# Patient Record
Sex: Female | Born: 1965 | Race: Black or African American | Hispanic: No | Marital: Married | Smoking: Never smoker
Health system: Southern US, Community
[De-identification: ages and names within clinical notes are randomized; demographics above are authoritative.]

## PROBLEM LIST (undated history)

## (undated) DIAGNOSIS — E079 Disorder of thyroid, unspecified: Secondary | ICD-10-CM

---

## 2008-09-30 DIAGNOSIS — E0590 Thyrotoxicosis, unspecified without thyrotoxic crisis or storm: Secondary | ICD-10-CM | POA: Insufficient documentation

## 2008-09-30 DIAGNOSIS — E039 Hypothyroidism, unspecified: Secondary | ICD-10-CM | POA: Insufficient documentation

## 2008-09-30 DIAGNOSIS — R109 Unspecified abdominal pain: Secondary | ICD-10-CM | POA: Insufficient documentation

## 2011-04-22 DIAGNOSIS — Z79899 Other long term (current) drug therapy: Secondary | ICD-10-CM | POA: Insufficient documentation

## 2011-04-22 DIAGNOSIS — M7918 Myalgia, other site: Secondary | ICD-10-CM

## 2011-04-22 DIAGNOSIS — IMO0001 Reserved for inherently not codable concepts without codable children: Secondary | ICD-10-CM | POA: Insufficient documentation

## 2011-04-22 DIAGNOSIS — E079 Disorder of thyroid, unspecified: Secondary | ICD-10-CM | POA: Insufficient documentation

## 2011-04-22 DIAGNOSIS — R109 Unspecified abdominal pain: Secondary | ICD-10-CM | POA: Insufficient documentation

## 2011-04-22 HISTORY — DX: Disorder of thyroid, unspecified: E07.9

## 2011-04-22 MED ADMIN — Morphine Sulfate Inj 4 MG/ML: 4 mg | INTRAVENOUS | NDC 00409189101

## 2011-04-22 MED ADMIN — Sodium Chloride IV Soln 0.9%: 1000 mL | INTRAVENOUS | NDC 00338004904

## 2011-04-22 MED ADMIN — Oxycodone w/ Acetaminophen Tab 5-325 MG: 2 | ORAL | NDC 68084035511

## 2012-02-29 DIAGNOSIS — Z1231 Encounter for screening mammogram for malignant neoplasm of breast: Secondary | ICD-10-CM

## 2012-03-05 DIAGNOSIS — Z1231 Encounter for screening mammogram for malignant neoplasm of breast: Secondary | ICD-10-CM | POA: Insufficient documentation

## 2013-11-13 DIAGNOSIS — Z1231 Encounter for screening mammogram for malignant neoplasm of breast: Secondary | ICD-10-CM

## 2013-11-25 DIAGNOSIS — Z1231 Encounter for screening mammogram for malignant neoplasm of breast: Secondary | ICD-10-CM | POA: Insufficient documentation

## 2019-04-16 DIAGNOSIS — Z20822 Contact with and (suspected) exposure to covid-19: Secondary | ICD-10-CM

## 2020-05-17 DIAGNOSIS — Z1231 Encounter for screening mammogram for malignant neoplasm of breast: Secondary | ICD-10-CM

## 2020-07-05 DIAGNOSIS — Z1231 Encounter for screening mammogram for malignant neoplasm of breast: Secondary | ICD-10-CM

## 2021-11-28 DIAGNOSIS — M7752 Other enthesopathy of left foot: Secondary | ICD-10-CM

## 2021-11-28 DIAGNOSIS — M25472 Effusion, left ankle: Secondary | ICD-10-CM

## 2021-11-28 DIAGNOSIS — M79672 Pain in left foot: Secondary | ICD-10-CM

## 2021-12-01 DIAGNOSIS — M25472 Effusion, left ankle: Secondary | ICD-10-CM

## 2021-12-01 DIAGNOSIS — M7752 Other enthesopathy of left foot: Secondary | ICD-10-CM

## 2021-12-01 DIAGNOSIS — M79672 Pain in left foot: Secondary | ICD-10-CM

## 2022-01-06 DIAGNOSIS — M7662 Achilles tendinitis, left leg: Secondary | ICD-10-CM | POA: Diagnosis not present

## 2022-02-02 DIAGNOSIS — N644 Mastodynia: Secondary | ICD-10-CM

## 2022-02-08 IMAGING — MG DIGITAL SCREENING BILAT W/ CAD
7 series · 7 of 7 positions shown · non-contrast
Comparison: Previous exam(s).

CLINICAL DATA: Screening.

EXAM:
DIGITAL SCREENING BILATERAL MAMMOGRAM WITH CAD
TECHNIQUE: Bilateral screening digital craniocaudal and mediolateral oblique
mammograms were obtained. The images were evaluated with
computer-aided detection.

[L CV]
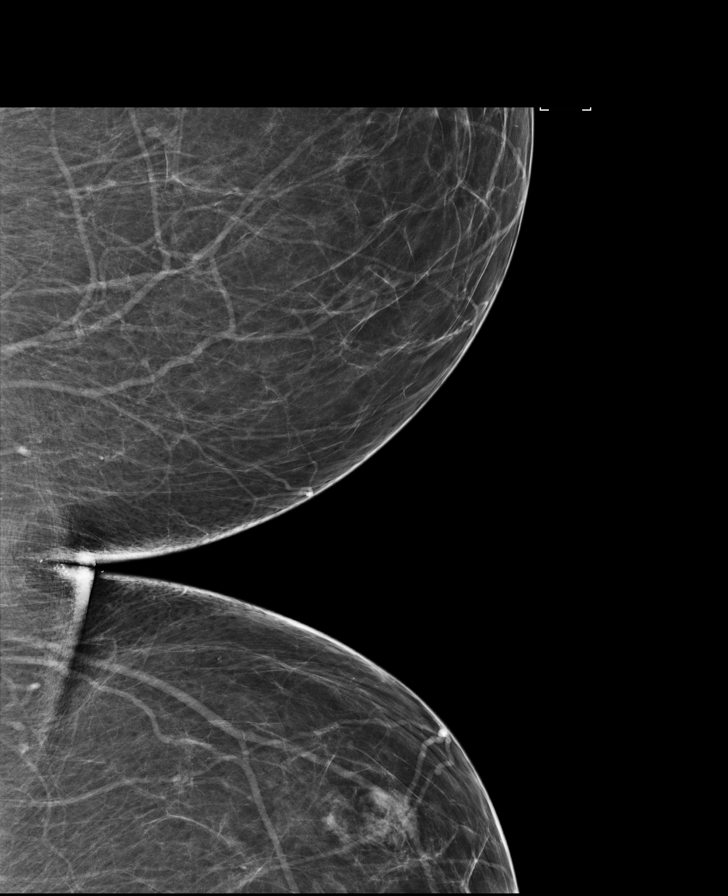

[R MLO (1 of 2)]
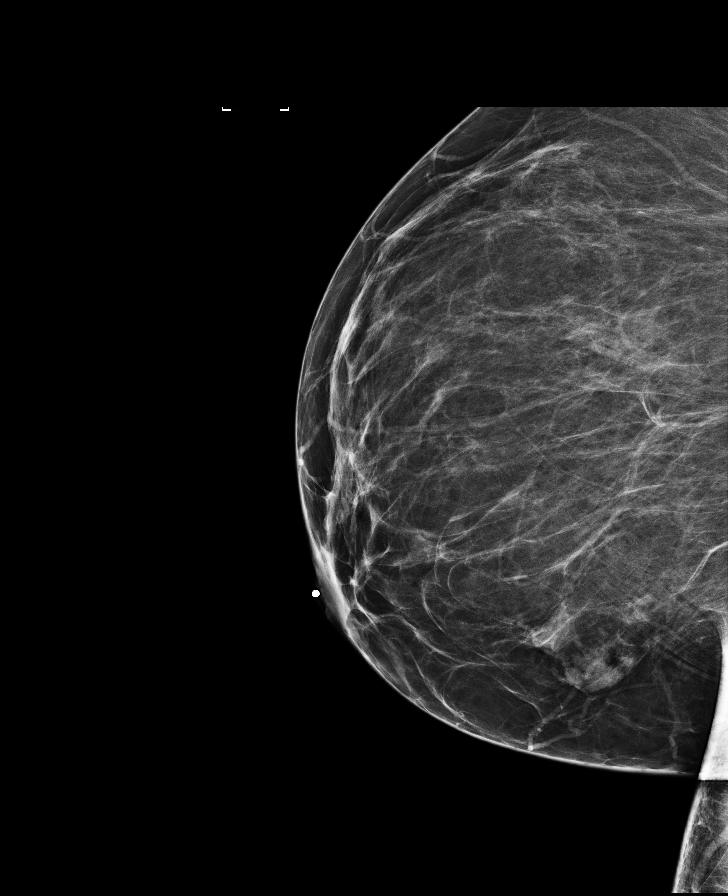

[L MLO (1 of 2)]
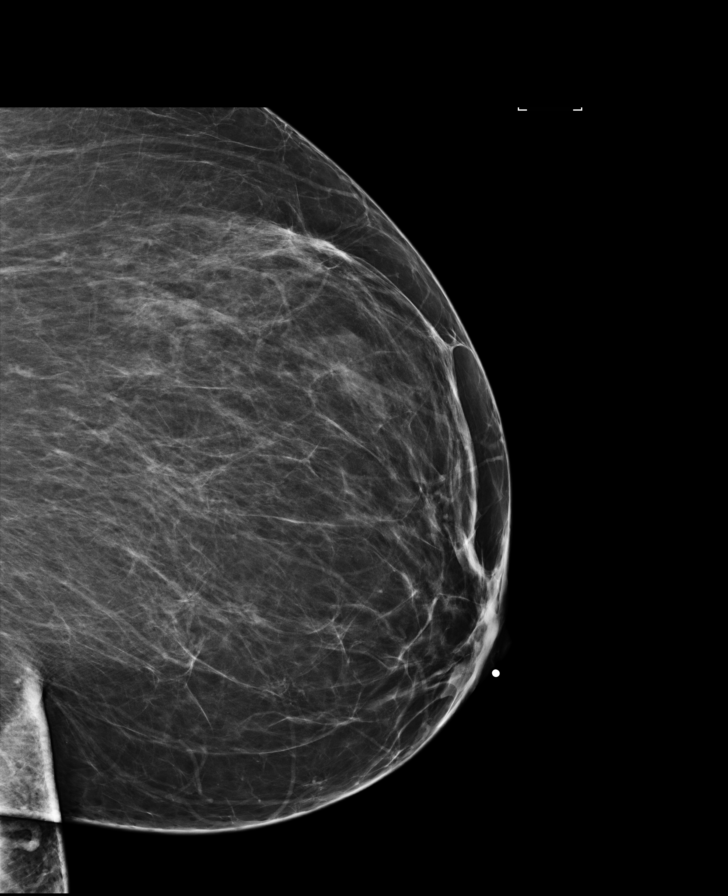

[L CC]
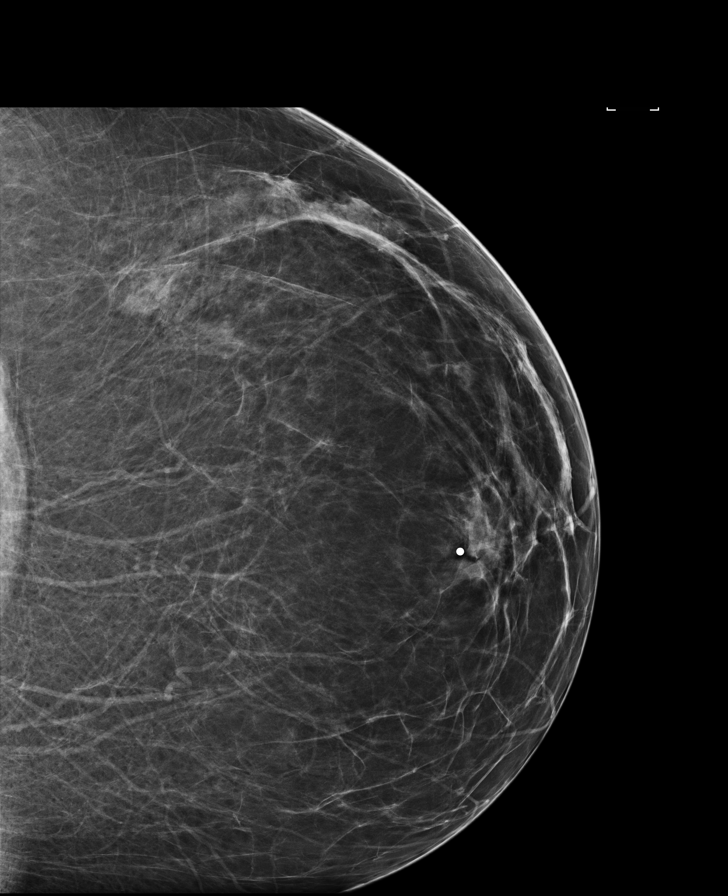

[L MLO (2 of 2)]
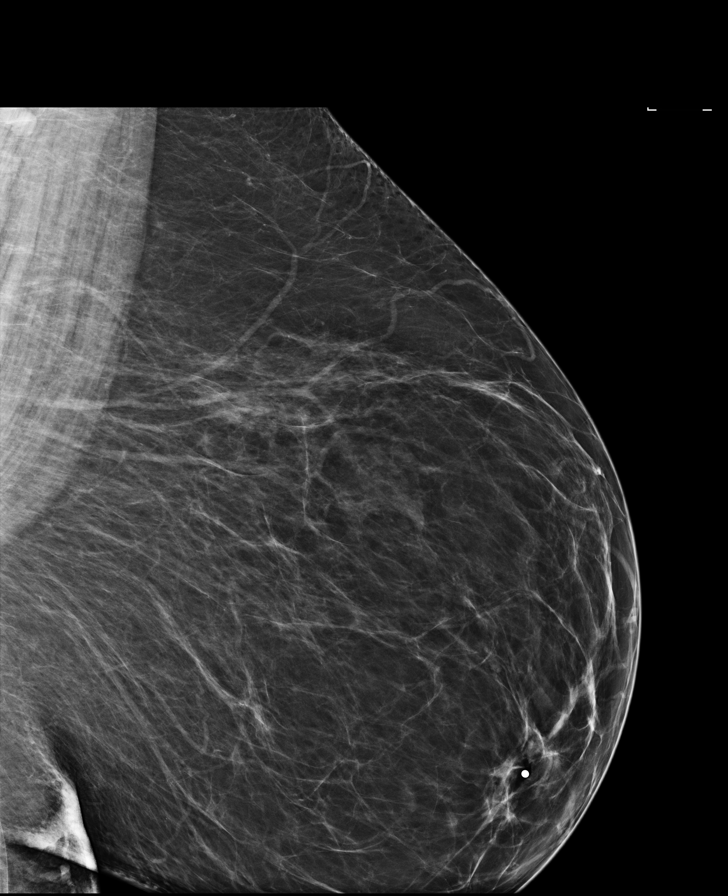

[R MLO (2 of 2)]
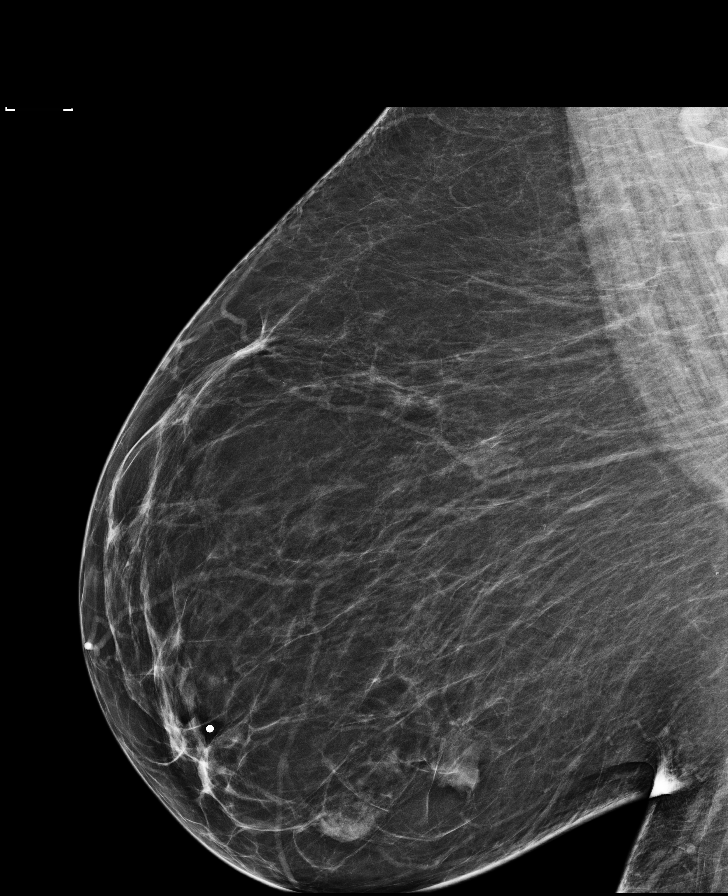

[R CC]
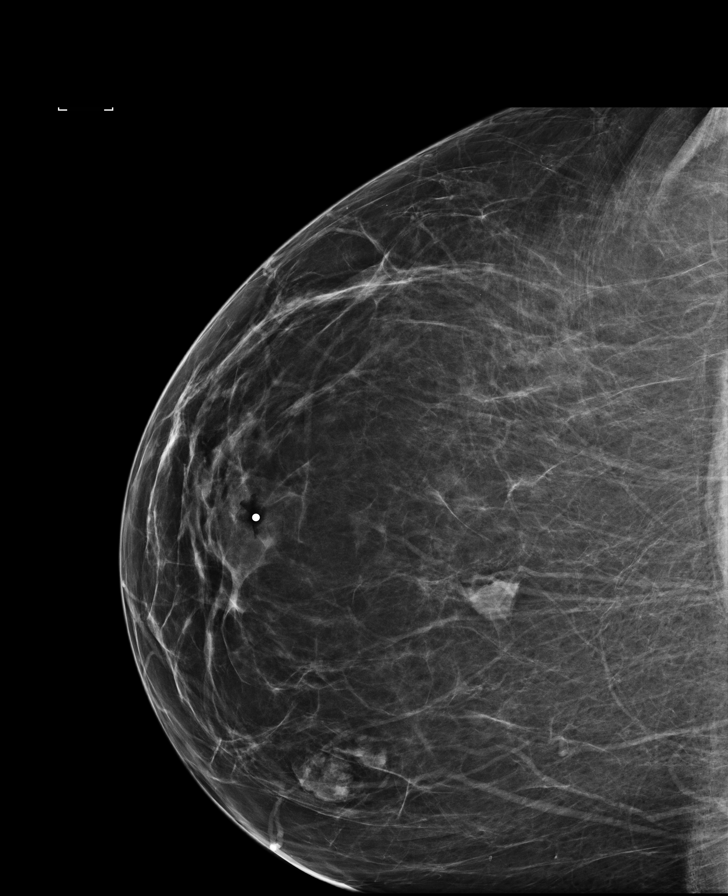

[7 of 7 positions shown; findings below may reference images not displayed]

ACR Breast Density Category b: There are scattered areas of
fibroglandular density.
FINDINGS: There are no findings suspicious for malignancy. The images were
evaluated with computer-aided detection.
IMPRESSION: No mammographic evidence of malignancy. A result letter of this
screening mammogram will be mailed directly to the patient.

RECOMMENDATION:
Screening mammogram in one year. (Code:[id number])

BI-RADS CATEGORY  1: Negative.

## 2022-02-23 DIAGNOSIS — N644 Mastodynia: Secondary | ICD-10-CM

## 2022-02-23 DIAGNOSIS — R921 Mammographic calcification found on diagnostic imaging of breast: Secondary | ICD-10-CM

## 2022-03-02 DIAGNOSIS — R921 Mammographic calcification found on diagnostic imaging of breast: Secondary | ICD-10-CM

## 2022-03-02 HISTORY — PX: BREAST BIOPSY: SHX20

## 2022-08-02 DIAGNOSIS — R921 Mammographic calcification found on diagnostic imaging of breast: Secondary | ICD-10-CM

## 2023-11-09 DIAGNOSIS — Z1231 Encounter for screening mammogram for malignant neoplasm of breast: Secondary | ICD-10-CM

## 2023-11-23 DIAGNOSIS — Z1231 Encounter for screening mammogram for malignant neoplasm of breast: Secondary | ICD-10-CM

## 2023-11-30 DIAGNOSIS — R921 Mammographic calcification found on diagnostic imaging of breast: Secondary | ICD-10-CM
